# Patient Record
Sex: Female | Born: 1975 | Race: Black or African American | Hispanic: No | Marital: Single | Smoking: Never smoker
Health system: Southern US, Community
[De-identification: ages and names within clinical notes are randomized; demographics above are authoritative.]

## PROBLEM LIST (undated history)

## (undated) DIAGNOSIS — K5090 Crohn's disease, unspecified, without complications: Secondary | ICD-10-CM

## (undated) DIAGNOSIS — I2699 Other pulmonary embolism without acute cor pulmonale: Secondary | ICD-10-CM

## (undated) DIAGNOSIS — J45909 Unspecified asthma, uncomplicated: Secondary | ICD-10-CM

## (undated) DIAGNOSIS — K219 Gastro-esophageal reflux disease without esophagitis: Secondary | ICD-10-CM

## (undated) DIAGNOSIS — H9190 Unspecified hearing loss, unspecified ear: Secondary | ICD-10-CM

## (undated) DIAGNOSIS — IMO0001 Reserved for inherently not codable concepts without codable children: Secondary | ICD-10-CM

## (undated) DIAGNOSIS — I82409 Acute embolism and thrombosis of unspecified deep veins of unspecified lower extremity: Secondary | ICD-10-CM

## (undated) DIAGNOSIS — R569 Unspecified convulsions: Secondary | ICD-10-CM

## (undated) DIAGNOSIS — C189 Malignant neoplasm of colon, unspecified: Secondary | ICD-10-CM

## (undated) HISTORY — PX: TYMPANOSTOMY TUBE PLACEMENT: SHX32

## (undated) HISTORY — PX: TONSILLECTOMY: SUR1361

## (undated) HISTORY — PX: BOWEL RESECTION: SHX1257

---

## 2017-02-19 ENCOUNTER — Other Ambulatory Visit: Payer: Self-pay

## 2017-02-19 ENCOUNTER — Encounter (HOSPITAL_BASED_OUTPATIENT_CLINIC_OR_DEPARTMENT_OTHER): Payer: Self-pay | Admitting: Emergency Medicine

## 2017-02-19 ENCOUNTER — Emergency Department (HOSPITAL_BASED_OUTPATIENT_CLINIC_OR_DEPARTMENT_OTHER)
Admission: EM | Admit: 2017-02-19 | Discharge: 2017-02-20 | Disposition: A | Discharge status: Home / Self Care | Payer: Medicaid Other | Attending: Emergency Medicine | Admitting: Emergency Medicine

## 2017-02-19 DIAGNOSIS — J45909 Unspecified asthma, uncomplicated: Secondary | ICD-10-CM | POA: Insufficient documentation

## 2017-02-19 DIAGNOSIS — Z85038 Personal history of other malignant neoplasm of large intestine: Secondary | ICD-10-CM | POA: Insufficient documentation

## 2017-02-19 DIAGNOSIS — Z86718 Personal history of other venous thrombosis and embolism: Secondary | ICD-10-CM | POA: Insufficient documentation

## 2017-02-19 DIAGNOSIS — G90522 Complex regional pain syndrome I of left lower limb: Secondary | ICD-10-CM | POA: Insufficient documentation

## 2017-02-19 DIAGNOSIS — Z7901 Long term (current) use of anticoagulants: Secondary | ICD-10-CM | POA: Diagnosis not present

## 2017-02-19 DIAGNOSIS — M545 Low back pain: Secondary | ICD-10-CM | POA: Diagnosis not present

## 2017-02-19 DIAGNOSIS — M79622 Pain in left upper arm: Secondary | ICD-10-CM | POA: Diagnosis not present

## 2017-02-19 DIAGNOSIS — R52 Pain, unspecified: Secondary | ICD-10-CM | POA: Diagnosis present

## 2017-02-19 HISTORY — DX: Unspecified convulsions: R56.9

## 2017-02-19 HISTORY — DX: Malignant neoplasm of colon, unspecified: C18.9

## 2017-02-19 HISTORY — DX: Unspecified asthma, uncomplicated: J45.909

## 2017-02-19 HISTORY — DX: Acute embolism and thrombosis of unspecified deep veins of unspecified lower extremity: I82.409

## 2017-02-19 HISTORY — DX: Other pulmonary embolism without acute cor pulmonale: I26.99

## 2017-02-19 HISTORY — DX: Crohn's disease, unspecified, without complications: K50.90

## 2017-02-19 HISTORY — DX: Gastro-esophageal reflux disease without esophagitis: K21.9

## 2017-02-19 HISTORY — DX: Unspecified hearing loss, unspecified ear: H91.90

## 2017-02-19 HISTORY — DX: Reserved for inherently not codable concepts without codable children: IMO0001

## 2017-02-19 MED ORDER — IBUPROFEN 400 MG PO TABS
600.0000 mg | ORAL_TABLET | Freq: Once | ORAL | Status: DC
  Filled 2017-02-19: qty 1

## 2017-02-19 MED ORDER — ONDANSETRON HCL 4 MG/2ML IJ SOLN
4.0000 mg | Freq: Once | INTRAMUSCULAR | Status: AC
  Administered 2017-02-19: 4 mg via INTRAVENOUS

## 2017-02-19 MED ORDER — OXYCODONE-ACETAMINOPHEN 5-325 MG PO TABS
2.0000 | ORAL_TABLET | Freq: Once | ORAL | Status: AC
  Administered 2017-02-19: 2 via ORAL
  Filled 2017-02-19: qty 2

## 2017-02-19 MED ORDER — ONDANSETRON HCL 4 MG/2ML IJ SOLN
INTRAMUSCULAR | Status: AC
  Administered 2017-02-19: 4 mg via INTRAVENOUS
  Filled 2017-02-19: qty 2

## 2017-02-19 NOTE — ED Notes (Addendum)
HOH, reads lips. Alert, NAD, interactive, resps e/u, speaking in clear complete sentences, hyperventilating, anxious, no dyspnea noted, skin W&D, VSS, c/o L side (arm & lower back) pain, onset yesterday, also sob, dizzy, nausea and HA, (denies: VD, fever, or bleeding), takes xarelto for "blood clots" dx'd in November. Family at [facility].

## 2017-02-19 NOTE — ED Notes (Signed)
EDP into room 

## 2017-02-19 NOTE — ED Triage Notes (Signed)
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

## 2017-02-20 ENCOUNTER — Encounter (HOSPITAL_BASED_OUTPATIENT_CLINIC_OR_DEPARTMENT_OTHER): Payer: Self-pay | Admitting: Adult Health

## 2017-02-20 ENCOUNTER — Other Ambulatory Visit: Payer: Self-pay

## 2017-02-20 ENCOUNTER — Emergency Department (HOSPITAL_BASED_OUTPATIENT_CLINIC_OR_DEPARTMENT_OTHER): Payer: Medicaid Other

## 2017-02-20 ENCOUNTER — Emergency Department (HOSPITAL_BASED_OUTPATIENT_CLINIC_OR_DEPARTMENT_OTHER)
Admission: EM | Admit: 2017-02-20 | Discharge: 2017-02-21 | Disposition: A | Discharge status: Home / Self Care | Payer: Medicaid Other | Source: Home / Self Care | Attending: Emergency Medicine | Admitting: Emergency Medicine

## 2017-02-20 DIAGNOSIS — L299 Pruritus, unspecified: Secondary | ICD-10-CM

## 2017-02-20 DIAGNOSIS — T887XXA Unspecified adverse effect of drug or medicament, initial encounter: Secondary | ICD-10-CM

## 2017-02-20 MED ORDER — PREDNISONE 50 MG PO TABS
60.0000 mg | ORAL_TABLET | Freq: Once | ORAL | Status: AC
  Administered 2017-02-20: 60 mg via ORAL
  Filled 2017-02-20: qty 1

## 2017-02-20 MED ORDER — FAMOTIDINE 20 MG PO TABS
20.0000 mg | ORAL_TABLET | Freq: Once | ORAL | Status: AC
  Administered 2017-02-20: 20 mg via ORAL
  Filled 2017-02-20: qty 1

## 2017-02-20 MED ORDER — HYDROXYZINE HCL 25 MG PO TABS
25.0000 mg | ORAL_TABLET | Freq: Once | ORAL | Status: AC
  Administered 2017-02-20: 25 mg via ORAL
  Filled 2017-02-20: qty 1

## 2017-02-20 NOTE — ED Notes (Signed)
Resting comfortably, calmer, NAD, resps e/u, no dyspnea, asked for ice chips (given), family at [facility], pending newly ordered xrays. Denies questions or needs. VSS.

## 2017-02-20 NOTE — ED Triage Notes (Signed)
Pt was here yesterday and given an Rx for percocet, This AM she wok up with hives and itching badly. She took a benadryl at 9 pm this evening.  No respiratory distress. No emesis VSS

## 2017-02-20 NOTE — ED Notes (Signed)
No changes, pt to xray. 

## 2017-02-20 NOTE — ED Notes (Signed)
Up to b/r by w/c, with family. Reports pain decreased, but remains.

## 2017-02-20 NOTE — ED Provider Notes (Signed)
[place] EMERGENCY DEPARTMENT Provider Note   CSN: [id number] Arrival date & time: 02/19/17  1934     History   Chief Complaint Chief Complaint  Patient presents with  . Left Side Body Pain    HPI Lori Bird is a 42 y.o. female.  HPI Patient is a 42 year old female with a history of chronic left lower extremity pain secondary to trauma that occurred to her left foot several months ago.  She has been diagnosed with complex regional pain syndrome and is currently being managed and followed at a [facility] pain clinic.  She was on gabapentin but recently was switched to Lyrica and is scheduled back for a procedure in the next 1-2 weeks.  She presents the emergency department because of increasing now left upper extremity pain this feels similar to her left lower extremity pain.  No recent injury or trauma to her left upper extremity.  No chest pain or shortness of breath.  Also reports left-sided pain and left low back pain.  No dysuria or urinary frequency.  No vaginal complaints.  She is currently on Xarelto for history of deep vein thrombosis.  Her pain is moderate to severe in severity.   Past Medical History:  Diagnosis Date  . Asthma   . Colon cancer ([place])   . Crohn's disease ([place])   . DVT of lower limb, acute ([place])   . GERD (gastroesophageal reflux disease)   . Hearing impaired   . Pulmonary embolism ([place])   . Seizures ([place])     There are no active problems to display for this patient.   Past Surgical History:  Procedure Laterality Date  . BOWEL RESECTION    . TONSILLECTOMY    . TYMPANOSTOMY TUBE PLACEMENT      OB History    No data available       Home Medications    Prior to Admission medications   Medication Sig Start Date End Date Taking? Authorizing Provider  Rivaroxaban (XARELTO PO) Take by mouth.   Yes [provider]    Family History No family history on file.  Social History Social History   Tobacco Use  .  Smoking status: Never Smoker  . Smokeless tobacco: Never Used  Substance Use Topics  . Alcohol use: Yes    Comment: occ  . Drug use: No     Allergies   Aspirin; Naproxen; Penicillins; Tramadol; and Ibuprofen   Review of Systems Review of Systems  All other systems reviewed and are negative.    Physical Exam Updated Vital Signs BP 114/73   Pulse (!) 55   Temp 97.8 F (36.6 C) (Oral)   Resp (!) 6   Ht 5\' 2"  (1.575 m)   Wt 74.4 kg (164 lb)   SpO2 95%   BMI 30.00 kg/m   Physical Exam  Constitutional: She is oriented to person, place, and time. She appears well-developed and well-nourished. No distress.  HENT:  Head: Normocephalic and atraumatic.  Eyes: EOM are normal.  Neck: Normal range of motion.  Cardiovascular: Normal rate, regular rhythm and normal heart sounds.  Pulmonary/Chest: Effort normal and breath sounds normal.  Abdominal: Soft. She exhibits no distension. There is no tenderness.  Musculoskeletal: Normal range of motion.  Full range of motion of bilateral shoulders, elbows and wrists. Full range of motion of bilateral hips, knees and ankles.  Normal left radial pulse.  Normal PT and DP pulse in the left foot.  No swelling of the left  upper extremity as compared to the right.  No rash of the left upper extremity or erythema or warmth.   Neurological: She is alert and oriented to person, place, and time.  Skin: Skin is warm and dry.  Psychiatric: She has a normal mood and affect. Judgment normal.  Nursing note and vitals reviewed.    ED Treatments / Results  Labs (all labs ordered are listed, but only abnormal results are displayed) Labs Reviewed - No data to display  EKG  EKG Interpretation None       Radiology Dg Chest 2 View  Result Date: 02/20/2017 CLINICAL DATA:  Shortness of breath nausea and fever EXAM: CHEST  2 VIEW COMPARISON:  None. FINDINGS: The heart size and mediastinal contours are within normal limits. Both lungs are clear. The  visualized skeletal structures are unremarkable. There appears to be dilated small bowel in the upper abdomen. IMPRESSION: No active cardiopulmonary disease. Suspicion of dilated small bowel in the upper abdomen, suggest dedicated abdominal radiographs to evaluate for obstruction. Electronically Signed   By: Donavan Foil M.D.   On: 02/20/2017 01:35   Dg Abd 2 Views  Result Date: 02/20/2017 CLINICAL DATA:  Left-sided chest and abdominal pain with fever and nausea EXAM: ABDOMEN - 2 VIEW COMPARISON:  None. FINDINGS: Nonobstructed gas pattern. No free air on decubitus view. Multiple calcifications in the pelvis are favored to represent phleboliths. IMPRESSION: 1. Nonobstructed gas pattern 2. Multiple calcifications in the pelvis favored to represent phleboliths although distal ureteral stone would be difficult to exclude. Electronically Signed   By: Donavan Foil M.D.   On: 02/20/2017 02:07    Procedures Procedures (including critical care time)  Medications Ordered in ED Medications  ibuprofen (ADVIL,MOTRIN) tablet 600 mg (600 mg Oral Not Given 02/19/17 2359)  oxyCODONE-acetaminophen (PERCOCET/ROXICET) 5-325 MG per tablet 2 tablet (2 tablets Oral Given 02/19/17 2359)  ondansetron (ZOFRAN) injection 4 mg (4 mg Intravenous Given 02/19/17 2359)     Initial Impression / Assessment and Plan / ED Course  I have reviewed the triage vital signs and the nursing notes.  Pertinent labs & imaging results that were available during my care of the patient were reviewed by me and considered in my medical decision making (see chart for details).     Complex regional pain syndrome and complex pain history.  Currently being managed by pain clinic.  She recently established with his pain clinic and thus I think that they need to add some additional medication to her regimen.  Normal left radial pulse.  Normal grip strength the left hand.  Doubt presentation of ACS.  No signs to suggest arterial insufficiency.   Currently on chronic anticoagulation.  Doubt DVT.  Pain improved here in the emergency department.  Discharged home in good condition with follow-up with her primary care physician and the pain team following along with her  Final Clinical Impressions(s) / ED Diagnoses   Final diagnoses:  Complex regional pain syndrome type 1 of left lower extremity  Left upper arm pain    ED Discharge Orders    None       Jola Schmidt, MD 02/20/17 [phone]

## 2017-02-21 MED ORDER — PREDNISONE 20 MG PO TABS: 40.0000 mg | ORAL_TABLET | Freq: Every day | ORAL | 0 refills | Status: AC

## 2017-02-21 MED ORDER — DIPHENHYDRAMINE HCL 25 MG PO TABS: 25.0000 mg | ORAL_TABLET | Freq: Four times a day (QID) | ORAL | 0 refills | Status: AC | PRN

## 2017-02-21 MED ORDER — FAMOTIDINE 20 MG PO TABS: 20.0000 mg | ORAL_TABLET | Freq: Every day | ORAL | 0 refills | Status: AC

## 2017-02-21 NOTE — ED Provider Notes (Signed)
[place] EMERGENCY DEPARTMENT Provider Note   CSN: [id number] Arrival date & time: 02/20/17  2141     History   Chief Complaint Chief Complaint  Patient presents with  . Urticaria    HPI Lori Bird is a 42 y.o. female.  HPI  This is a 42 year old female with a history of Crohn's disease, hearing impairment, pulmonary embolism, seizures who presents with itching and rash.  Patient reports that she was here less than 24 hours ago.  At that time she was evaluated for extremity pain.  She was given a dose of Percocet.  She states that upon going home she noted diffuse itching.  Worse over her buttock.  She also today noted rash over her arms.  This improved when she was administered Benadryl she no longer has rash.  In general developed sleepy most of the day.  She denies any nausea, vomiting, shortness of breath, throat swelling.  Percocet is the only new medication.  Denies any new soaps, detergents, lotions.  Past Medical History:  Diagnosis Date  . Asthma   . Colon cancer ([place])   . Crohn's disease ([place])   . DVT of lower limb, acute ([place])   . GERD (gastroesophageal reflux disease)   . Hearing impaired   . Pulmonary embolism ([place])   . Seizures ([place])     There are no active problems to display for this patient.   Past Surgical History:  Procedure Laterality Date  . BOWEL RESECTION    . TONSILLECTOMY    . TYMPANOSTOMY TUBE PLACEMENT      OB History    No data available       Home Medications    Prior to Admission medications   Medication Sig Start Date End Date Taking? Authorizing Provider  diphenhydrAMINE (BENADRYL) 25 MG tablet Take 1 tablet (25 mg total) by mouth every 6 (six) hours as needed. 02/21/17   Horton, Barbette Hair, MD  famotidine (PEPCID) 20 MG tablet Take 1 tablet (20 mg total) by mouth daily. 02/21/17   Horton, Barbette Hair, MD  predniSONE (DELTASONE) 20 MG tablet Take 2 tablets (40 mg total) by mouth daily. 02/21/17   Horton, Barbette Hair,  MD  Rivaroxaban (XARELTO PO) Take by mouth.    [provider]    Family History History reviewed. No pertinent family history.  Social History Social History   Tobacco Use  . Smoking status: Never Smoker  . Smokeless tobacco: Never Used  Substance Use Topics  . Alcohol use: Yes    Comment: occ  . Drug use: No     Allergies   Aspirin; Latex; Naproxen; Penicillins; Percocet [oxycodone-acetaminophen]; Tramadol; and Ibuprofen   Review of Systems Review of Systems  Constitutional: Negative for fever.  HENT: Negative for trouble swallowing.   Respiratory: Negative for shortness of breath.   Cardiovascular: Negative for chest pain.  Gastrointestinal: Negative for abdominal pain, nausea and vomiting.  Skin: Positive for rash.       Itching  All other systems reviewed and are negative.    Physical Exam Updated Vital Signs BP 112/72 (BP Location: Right Arm)   Pulse 77   Temp 97.7 F (36.5 C) (Oral)   Resp 18   SpO2 100%   Physical Exam  Constitutional: She is oriented to person, place, and time. She appears well-developed and well-nourished. No distress.  HENT:  Head: Normocephalic and atraumatic.  Mouth/Throat: Oropharynx is clear and moist. No oropharyngeal exudate.  Cardiovascular: Normal rate, regular rhythm and  normal heart sounds.  Pulmonary/Chest: Effort normal. No respiratory distress. She has no wheezes.  Abdominal: Soft. Bowel sounds are normal. There is no tenderness. There is no guarding.  Neurological: She is alert and oriented to person, place, and time.  Skin: Skin is warm and dry. No rash noted.  No appreciable rash or urticaria noted, excoriations noted over the bilateral arms likely secondary to scratching  Psychiatric: She has a normal mood and affect.  Nursing note and vitals reviewed.    ED Treatments / Results  Labs (all labs ordered are listed, but only abnormal results are displayed) Labs Reviewed - No data to display  EKG   EKG Interpretation None       Radiology Dg Chest 2 View  Result Date: 02/20/2017 CLINICAL DATA:  Shortness of breath nausea and fever EXAM: CHEST  2 VIEW COMPARISON:  None. FINDINGS: The heart size and mediastinal contours are within normal limits. Both lungs are clear. The visualized skeletal structures are unremarkable. There appears to be dilated small bowel in the upper abdomen. IMPRESSION: No active cardiopulmonary disease. Suspicion of dilated small bowel in the upper abdomen, suggest dedicated abdominal radiographs to evaluate for obstruction. Electronically Signed   By: Donavan Foil M.D.   On: 02/20/2017 01:35   Dg Abd 2 Views  Result Date: 02/20/2017 CLINICAL DATA:  Left-sided chest and abdominal pain with fever and nausea EXAM: ABDOMEN - 2 VIEW COMPARISON:  None. FINDINGS: Nonobstructed gas pattern. No free air on decubitus view. Multiple calcifications in the pelvis are favored to represent phleboliths. IMPRESSION: 1. Nonobstructed gas pattern 2. Multiple calcifications in the pelvis favored to represent phleboliths although distal ureteral stone would be difficult to exclude. Electronically Signed   By: Donavan Foil M.D.   On: 02/20/2017 02:07    Procedures Procedures (including critical care time)  Medications Ordered in ED Medications  hydrOXYzine (ATARAX/VISTARIL) tablet 25 mg (25 mg Oral Given 02/20/17 2346)  predniSONE (DELTASONE) tablet 60 mg (60 mg Oral Given 02/20/17 2346)  famotidine (PEPCID) tablet 20 mg (20 mg Oral Given 02/20/17 2346)     Initial Impression / Assessment and Plan / ED Course  I have reviewed the triage vital signs and the nursing notes.  Pertinent labs & imaging results that were available during my care of the patient were reviewed by me and considered in my medical decision making (see chart for details).     Patient presents with itching and reports rash which is improved with Benadryl.  She is otherwise nontoxic appearing.  No signs or  symptoms of anaphylaxis.  Patient given Pepcid, Atarax, prednisone.  On recheck, she states that she feels better and is no longer itching.  We will treat supportively at home.  Likely adverse reaction to medication.  After history, exam, and medical workup I feel the patient has been appropriately medically screened and is safe for discharge home. Pertinent diagnoses were discussed with the patient. Patient was given return precautions.   Final Clinical Impressions(s) / ED Diagnoses   Final diagnoses:  Pruritus  Non-dose-related adverse effect of medication, initial encounter    ED Discharge Orders        Ordered    famotidine (PEPCID) 20 MG tablet  Daily     02/21/17 0027    diphenhydrAMINE (BENADRYL) 25 MG tablet  Every 6 hours PRN     02/21/17 0027    predniSONE (DELTASONE) 20 MG tablet  Daily     02/21/17 0027  Merryl Hacker, MD 02/21/17 [phone]

## 2017-02-21 NOTE — Discharge Instructions (Signed)
You were seen today for itching and rash after taking a medication yesterday.  You likely had an adverse reaction to Percocet.  Take prednisone daily for 4 more days.  Benadryl as needed.  If you develop worsening rash or any new or worsening shortness of breath, sore throat, you should be reevaluated.

## 2019-06-23 IMAGING — CR DG ABDOMEN 2V
3 series · 3 of 3 positions shown · non-contrast
Comparison: None.

CLINICAL DATA: Left-sided chest and abdominal pain with fever and
nausea

EXAM:
ABDOMEN - 2 VIEW

[t abdomen supine]
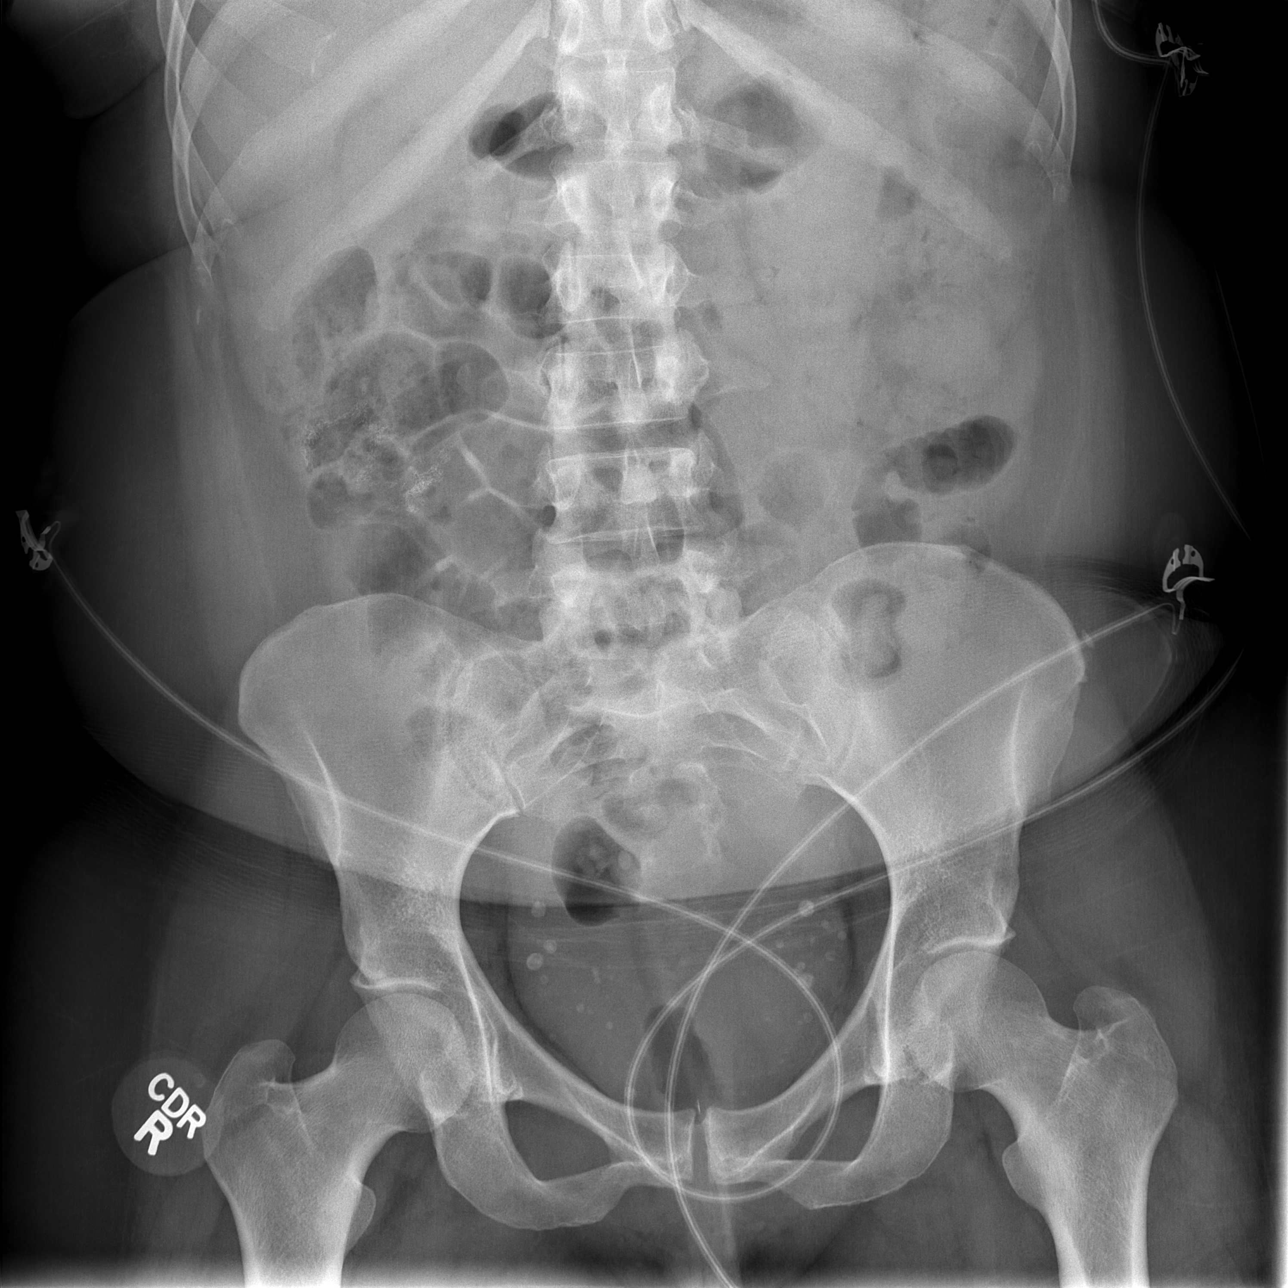

[w abdomen decub (1 of 2)]
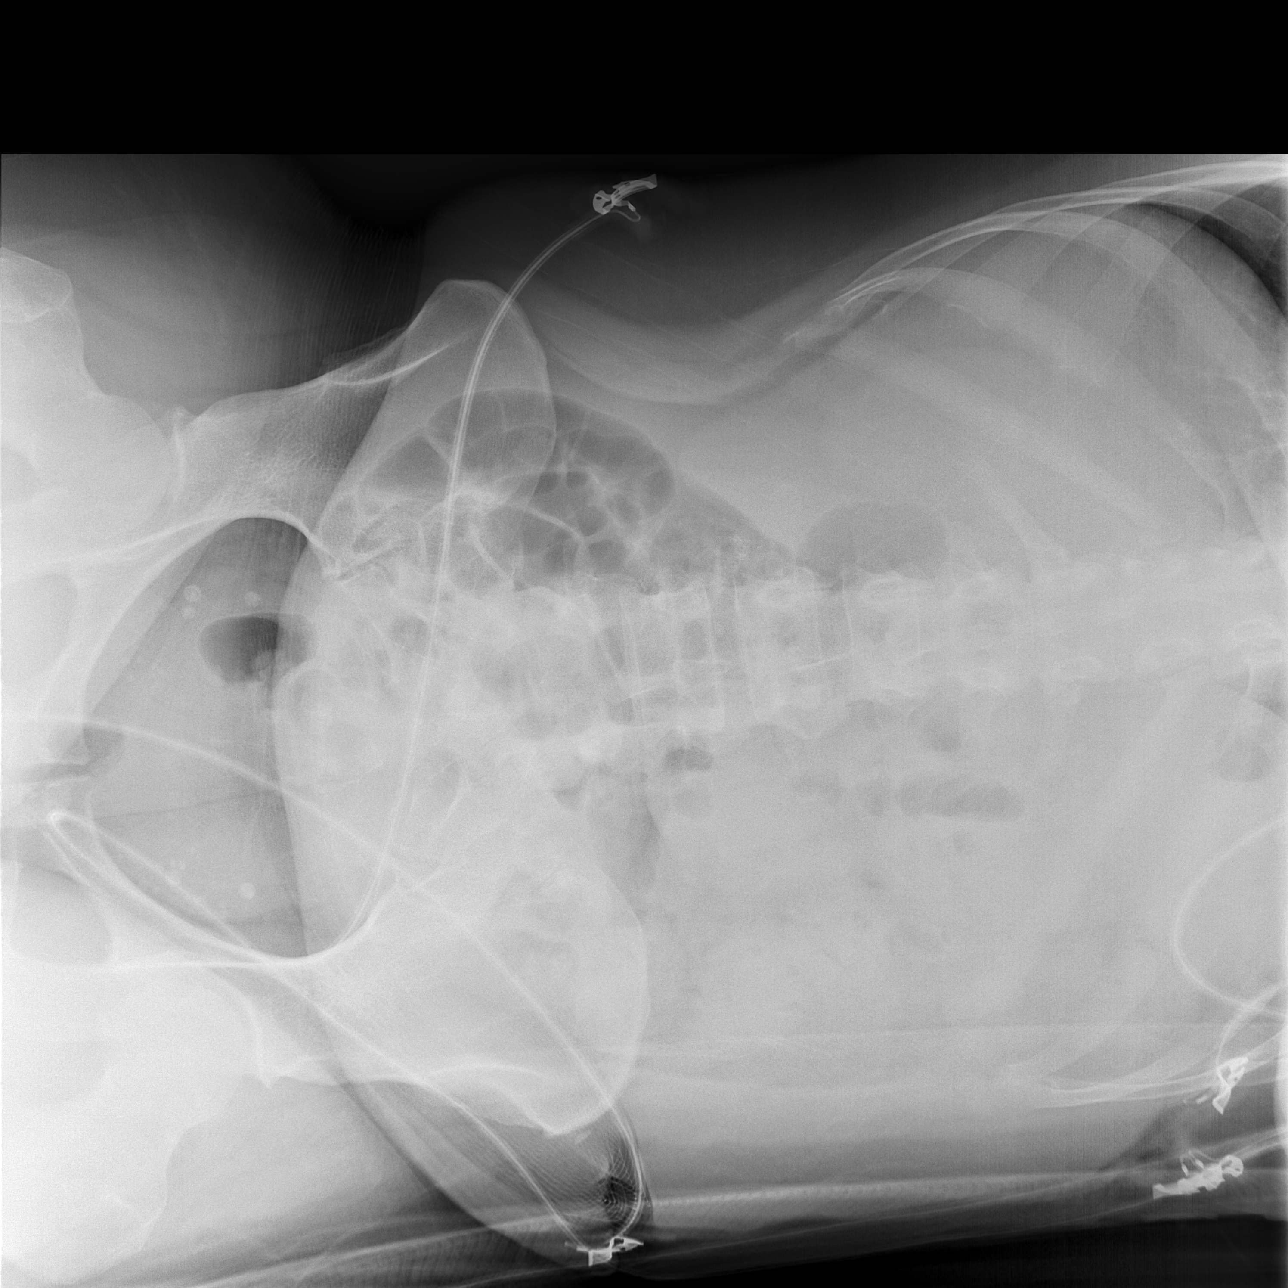

[w abdomen decub (2 of 2)]
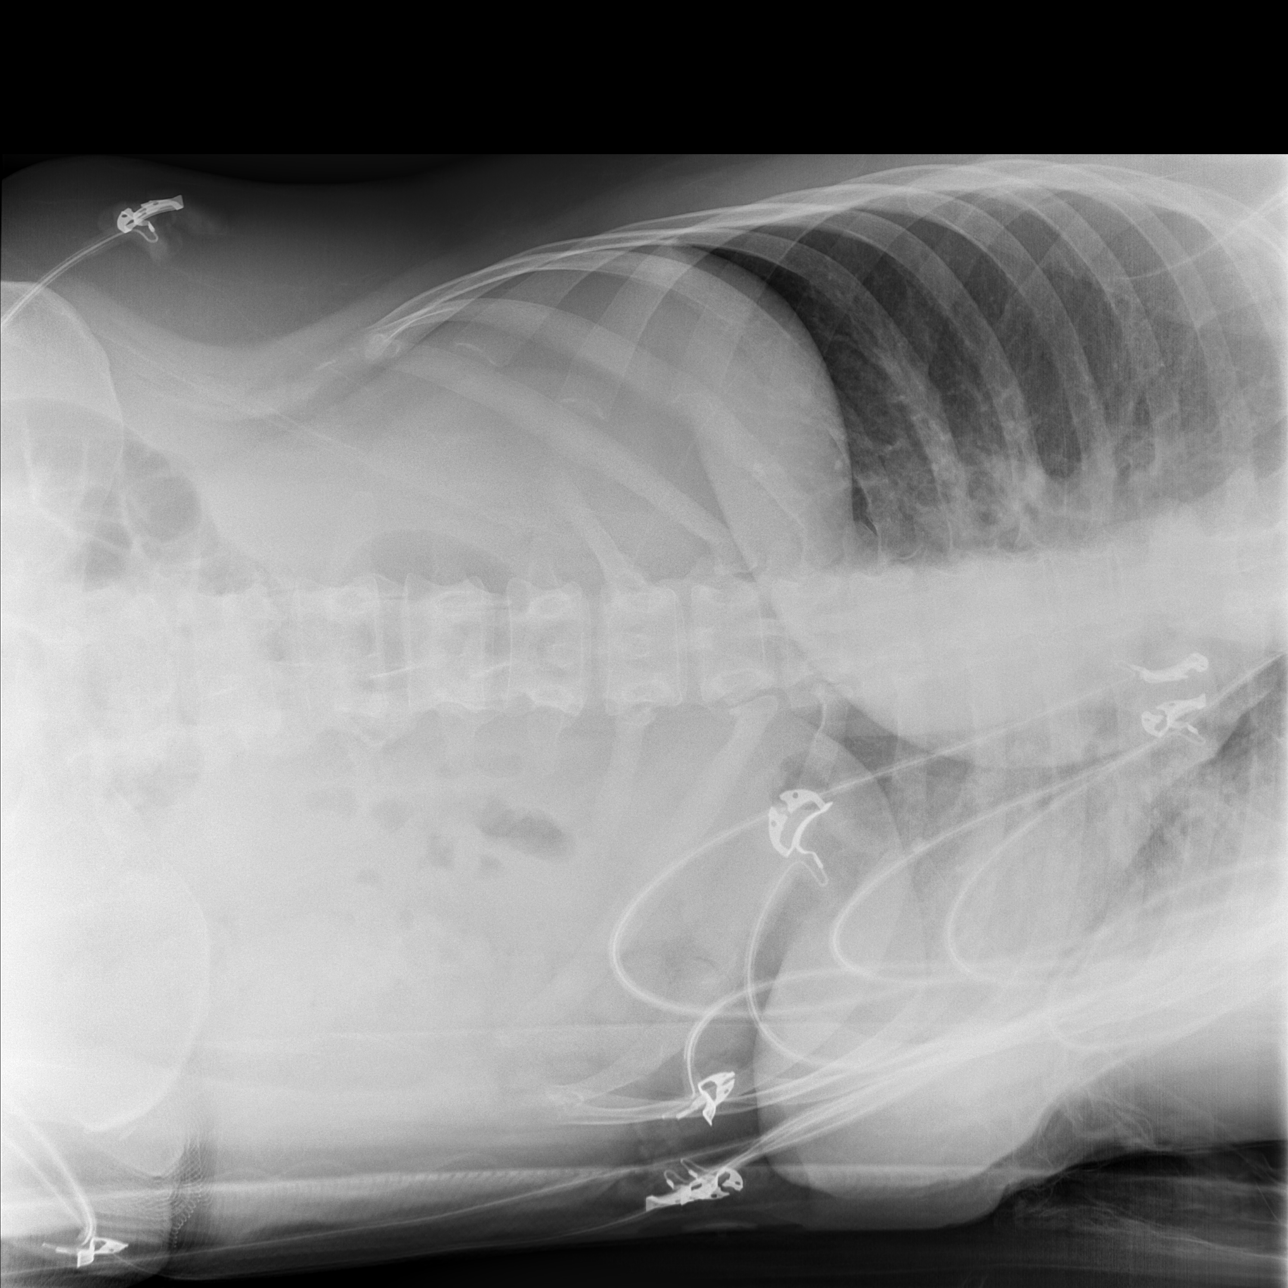

[3 of 3 positions shown; findings below may reference images not displayed]

FINDINGS: Nonobstructed gas pattern. No free air on decubitus view. Multiple
calcifications in the pelvis are favored to represent phleboliths.
IMPRESSION: 1. Nonobstructed gas pattern
2. Multiple calcifications in the pelvis favored to represent
phleboliths although distal ureteral stone would be difficult to
exclude.
